# Patient Record
Sex: Female | Born: 1975 | Race: White | Hispanic: No | Marital: Single | Smoking: Never smoker
Health system: Southern US, Community
[De-identification: ages and names within clinical notes are randomized; demographics above are authoritative.]

## PROBLEM LIST (undated history)

## (undated) DIAGNOSIS — R3129 Other microscopic hematuria: Secondary | ICD-10-CM

## (undated) DIAGNOSIS — I10 Essential (primary) hypertension: Secondary | ICD-10-CM

## (undated) DIAGNOSIS — N2889 Other specified disorders of kidney and ureter: Secondary | ICD-10-CM

## (undated) DIAGNOSIS — N83299 Other ovarian cyst, unspecified side: Secondary | ICD-10-CM

## (undated) DIAGNOSIS — N200 Calculus of kidney: Secondary | ICD-10-CM

## (undated) HISTORY — DX: Other specified disorders of kidney and ureter: N28.89

## (undated) HISTORY — DX: Essential (primary) hypertension: I10

## (undated) HISTORY — DX: Other microscopic hematuria: R31.29

## (undated) HISTORY — DX: Other ovarian cyst, unspecified side: N83.299

## (undated) HISTORY — DX: Calculus of kidney: N20.0

---

## 1994-01-28 HISTORY — PX: CHOLECYSTECTOMY: SHX55

## 2012-07-09 ENCOUNTER — Ambulatory Visit: Payer: Self-pay

## 2012-07-09 LAB — URINALYSIS, COMPLETE
Bilirubin,UR: NEGATIVE
Leukocyte Esterase: NEGATIVE
Ph: 6 (ref 4.5–8.0)
Protein: 30

## 2012-07-26 ENCOUNTER — Ambulatory Visit: Payer: Self-pay

## 2012-07-26 LAB — URINALYSIS, COMPLETE
Bilirubin,UR: NEGATIVE
Blood: NEGATIVE
Glucose,UR: NEGATIVE mg/dL (ref 0–75)
Ketone: NEGATIVE
Ph: 6 (ref 4.5–8.0)
Specific Gravity: 1.01 (ref 1.003–1.030)
WBC UR: >30 /HPF (ref 0–5)

## 2014-05-10 ENCOUNTER — Ambulatory Visit
Admit: 2014-05-10 | Disposition: A | Discharge status: Home / Self Care | Payer: Self-pay | Attending: Family Medicine | Admitting: Family Medicine

## 2014-05-10 LAB — URINALYSIS, COMPLETE
BACTERIA: NEGATIVE
Bilirubin,UR: NEGATIVE
Glucose,UR: NEGATIVE
LEUKOCYTE ESTERASE: NEGATIVE
Nitrite: NEGATIVE
Ph: 6 (ref 5.0–8.0)
WBC UR: NONE SEEN /HPF (ref 0–5)

## 2014-05-14 ENCOUNTER — Ambulatory Visit
Admit: 2014-05-14 | Disposition: A | Discharge status: Home / Self Care | Payer: Self-pay | Attending: Family Medicine | Admitting: Family Medicine

## 2014-05-14 LAB — URINALYSIS, COMPLETE
BACTERIA: NEGATIVE
BILIRUBIN, UR: NEGATIVE
GLUCOSE, UR: NEGATIVE
KETONE: NEGATIVE
Leukocyte Esterase: NEGATIVE
Nitrite: NEGATIVE
Ph: 5.5 (ref 5.0–8.0)
RBC,UR: >30 /HPF (ref 0–5)
SPECIFIC GRAVITY: 1.02 (ref 1.000–1.030)

## 2014-05-14 LAB — PREGNANCY, URINE: Pregnancy Test, Urine: NEGATIVE m[IU]/mL

## 2014-05-16 LAB — URINE CULTURE

## 2014-05-23 ENCOUNTER — Ambulatory Visit
Admit: 2014-05-23 | Disposition: A | Discharge status: Home / Self Care | Payer: Self-pay | Attending: Urology | Admitting: Urology

## 2014-07-18 ENCOUNTER — Other Ambulatory Visit: Payer: Self-pay | Admitting: Family Medicine

## 2014-07-18 DIAGNOSIS — N2889 Other specified disorders of kidney and ureter: Secondary | ICD-10-CM

## 2014-11-22 ENCOUNTER — Encounter: Payer: Self-pay | Admitting: *Deleted

## 2014-12-01 ENCOUNTER — Ambulatory Visit: Payer: BC Managed Care – PPO | Admitting: Urology

## 2015-08-14 ENCOUNTER — Ambulatory Visit
Admission: EM | Admit: 2015-08-14 | Discharge: 2015-08-14 | Disposition: A | Discharge status: Home / Self Care | Payer: BC Managed Care – PPO | Attending: Family Medicine | Admitting: Family Medicine

## 2015-08-14 ENCOUNTER — Encounter: Payer: Self-pay | Admitting: *Deleted

## 2015-08-14 DIAGNOSIS — H9202 Otalgia, left ear: Secondary | ICD-10-CM | POA: Diagnosis not present

## 2015-08-14 DIAGNOSIS — H6122 Impacted cerumen, left ear: Secondary | ICD-10-CM | POA: Diagnosis not present

## 2015-08-14 MED ORDER — AMOXICILLIN 875 MG PO TABS: 875.0000 mg | ORAL_TABLET | Freq: Two times a day (BID) | ORAL | Status: DC

## 2015-08-14 NOTE — ED Notes (Signed)
Distorted hearing in left ear last week. Onset of left ear pain yesterday. Denies drainage and fever.

## 2015-08-14 NOTE — ED Provider Notes (Signed)
CSN: [id number]     Arrival date & time 08/14/15  1442 History   First MD Initiated Contact with Patient 08/14/15 1512     Chief Complaint  Patient presents with  . Otalgia   (Consider location/radiation/quality/duration/timing/severity/associated sxs/prior Treatment) HPI Comments: 40 yo female with a 1 week h/o left ear pain and "fullness" sensation. Denies any drainage, fevers, chills.  The history is provided by the patient.    Past Medical History  Diagnosis Date  . Functional ovarian cysts   . Renal mass, right   . Microscopic hematuria   . Kidney stone on left side   . Hypertension    Past Surgical History  Procedure Laterality Date  . Cholecystectomy  1996   Family History  Problem Relation Age of Onset  . Prostate cancer Father   . Cancer Father     Blood   Social History  Substance Use Topics  . Smoking status: Never Smoker   . Smokeless tobacco: None  . Alcohol Use: No   OB History    No data available     Review of Systems  Allergies  Review of patient's allergies indicates no known allergies.  Home Medications   Prior to Admission medications   Medication Sig Start Date End Date Taking? Authorizing Provider  amoxicillin (AMOXIL) 875 MG tablet Take 1 tablet (875 mg total) by mouth 2 (two) times daily. 08/14/15   Payton Mccallum, MD   Meds Ordered and Administered this Visit  Medications - No data to display  BP 152/99 mmHg  Pulse 64  Temp(Src) 99 F (37.2 C) (Oral)  Resp 16  Ht  (1.727 m)  Wt 155 lb (70.308 kg)  BMI 23.57 kg/m2  SpO2 100%  LMP 08/13/2015 (Exact Date) No data found.   Physical Exam  Constitutional: She appears well-developed and well-nourished. No distress.  HENT:  Head: Normocephalic and atraumatic.  Right Ear: Tympanic membrane, external ear and ear canal normal.  Left Ear: External ear normal.  Nose: Mucosal edema and rhinorrhea present. No nose lacerations, sinus tenderness, nasal deformity, septal deviation or  nasal septal hematoma. No epistaxis.  No foreign bodies. Right sinus exhibits maxillary sinus tenderness and frontal sinus tenderness. Left sinus exhibits maxillary sinus tenderness and frontal sinus tenderness.  Mouth/Throat: Uvula is midline, oropharynx is clear and moist and mucous membranes are normal. No oropharyngeal exudate.  Left ear canal with cerumen impaction; TM visualized after cerumen disimpaction and slightly erythematous and bulging  Eyes: Conjunctivae and EOM are normal. Pupils are equal, round, and reactive to light. Right eye exhibits no discharge. Left eye exhibits no discharge. No scleral icterus.  Neck: Normal range of motion. Neck supple. No thyromegaly present.  Lymphadenopathy:    She has no cervical adenopathy.  Skin: She is not diaphoretic.  Nursing note and vitals reviewed.   ED Course  Procedures (including critical care time)  Labs Review Labs Reviewed - No data to display  Imaging Review No results found.   Visual Acuity Review  Right Eye Distance:   Left Eye Distance:   Bilateral Distance:    Right Eye Near:   Left Eye Near:    Bilateral Near:         MDM   1. Otalgia, left   2. Cerumen impaction, left    Discharge Medication List as of 08/14/2015  3:34 PM    START taking these medications   Details  amoxicillin (AMOXIL) 875 MG tablet Take 1 tablet (875 mg total) by  mouth 2 (two) times daily., Starting 08/14/2015, Until Discontinued, Print       1.  diagnosis reviewed with patient 2. rx as per orders above; reviewed possible side effects, interactions, risks and benefits  3. Recommend supportive treatment with otc analgesics prn 4. Follow-up prn if symptoms worsen or don't improve   Payton Mccallumrlando Deetta Siegmann, MD 08/14/15 1540

## 2015-08-14 NOTE — Discharge Instructions (Signed)
Earache An earache, also called otalgia, can be caused by many things. Pain from an earache can be sharp, dull, or burning. The pain may be temporary or constant. Earaches can be caused by problems with the ear, such as infection in either the middle ear or the ear canal, injury, impacted ear wax, middle ear pressure, or a foreign body in the ear. Ear pain can also result from problems in other areas. This is called referred pain. For example, pain can come from a sore throat, a tooth infection, or problems with the jaw or the joint between the jaw and the skull (temporomandibular joint, or TMJ). The cause of an earache is not always easy to identify. Watchful waiting may be appropriate for some earaches until a clear cause of the pain can be found. HOME CARE INSTRUCTIONS Watch your condition for any changes. The following actions may help to lessen any discomfort that you are feeling:  Take medicines only as directed by your health care provider. This includes ear drops.  Apply ice to your outer ear to help reduce pain.  Put ice in a plastic bag.  Place a towel between your skin and the bag.  Leave the ice on for 20 minutes, 2-3 times per day.  Do not put anything in your ear other than medicine that is prescribed by your health care provider.  Try resting in an upright position instead of lying down. This may help to reduce pressure in the middle ear and relieve pain.  Chew gum if it helps to relieve your ear pain.  Control any allergies that you have.  Keep all follow-up visits as directed by your health care provider. This is important. SEEK MEDICAL CARE IF:  Your pain does not improve within 2 days.  You have a fever.  You have new or worsening symptoms. SEEK IMMEDIATE MEDICAL CARE IF:  You have a severe headache.  You have a stiff neck.  You have difficulty swallowing.  You have redness or swelling behind your ear.  You have drainage from your ear.  You have hearing  loss.  You feel dizzy.   This information is not intended to replace advice given to you by your health care provider. Make sure you discuss any questions you have with your health care provider.   Document Released: 09/01/2003 Document Revised: 02/04/2014 Document Reviewed: 08/15/2013 Elsevier Interactive Patient Education 2016 [employer]  

## 2016-01-05 IMAGING — CT CT ABDOMEN WO/W CM
2 of 4 series · 12 of 32 positions shown, 18 images · IV contrast (omnipaque)
Comparison: Noncontrast CT on 05/14/2014

CLINICAL DATA: Left flank pain. Left ureteral calculus and
hydronephrosis. Indeterminate complex cystic right renal mass seen
on recent noncontrast CT.

EXAM:
CT ABDOMEN WITHOUT AND WITH CONTRAST
TECHNIQUE: Multidetector CT imaging of the abdomen was performed following the
standard protocol before and following the bolus administration of
intravenous contrast.
CONTRAST:  100 mL Omnipaque 350

[Series 3: lung · axial · 0.70mm/px · z∈[-663,-503]mm · 3 of 64 slices shown]
[im 16/64  bone]
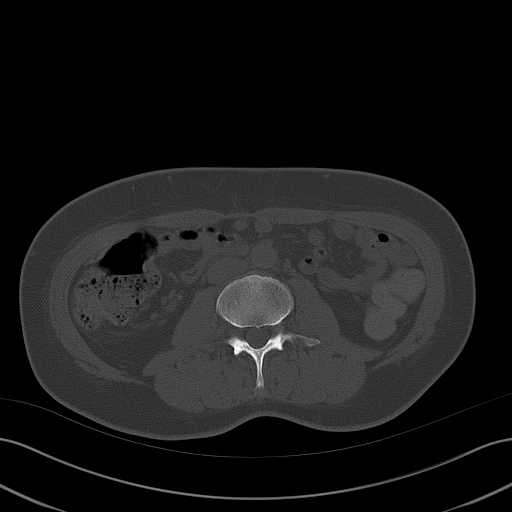
[im 32/64  bone]
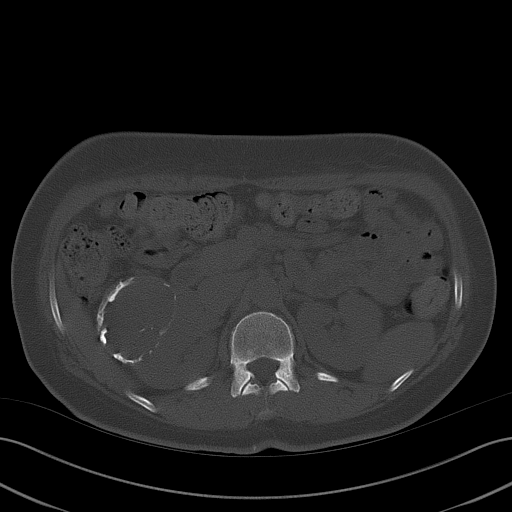
[im 48/64  bone]
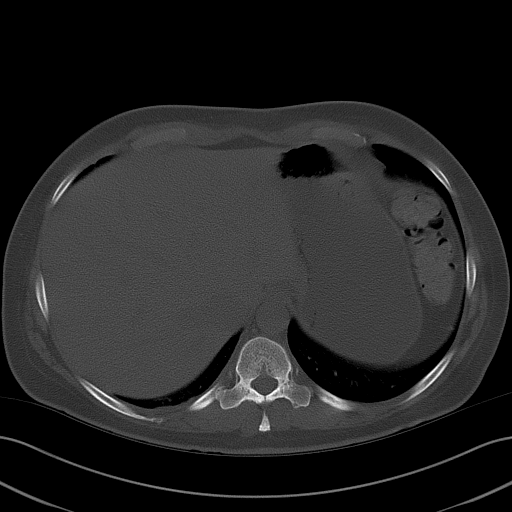

[Series 6: renal venous · axial · portal-venous · 0.70mm/px · z∈[-709,-455]mm · 9 of 159 slices shown, 15 images]
[im 16/159  soft-tissue]
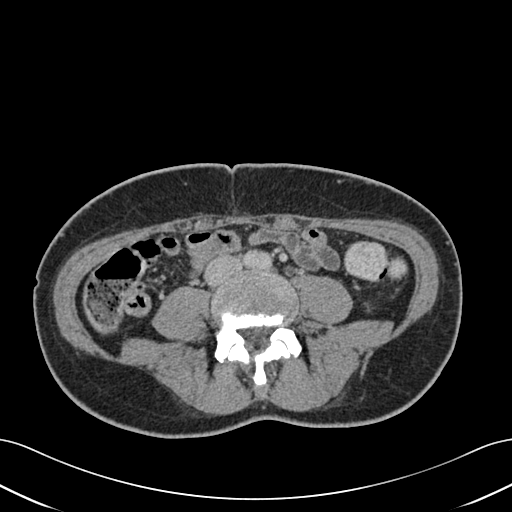
[im 16/159  bone]
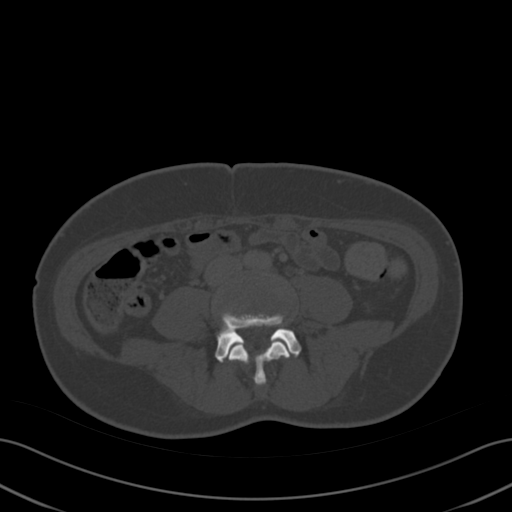
[im 32/159  soft-tissue]
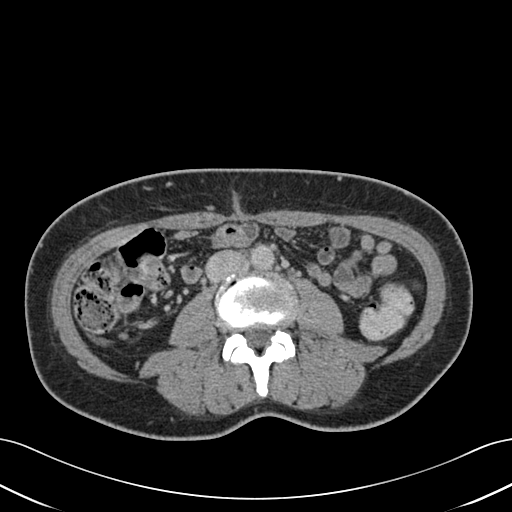
[im 48/159  soft-tissue]
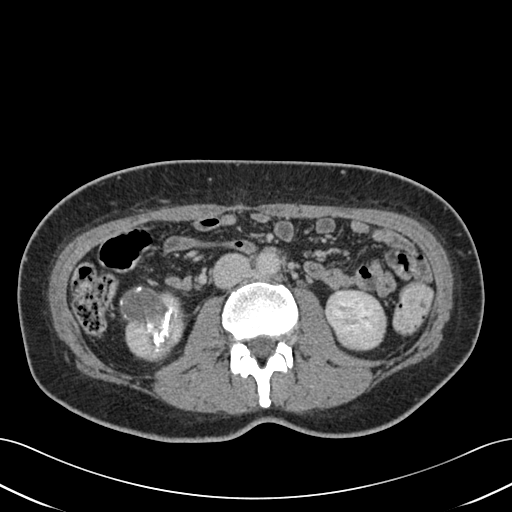
[im 64/159  soft-tissue]
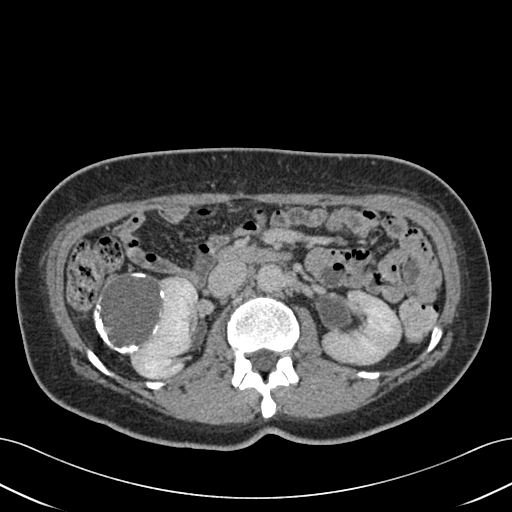
[im 80/159  soft-tissue]
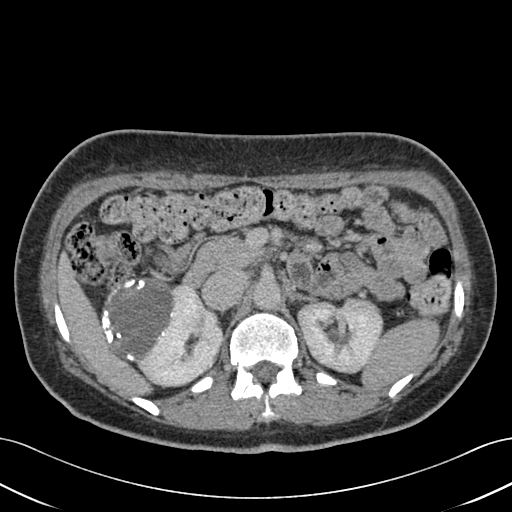
[im 95/159  soft-tissue]
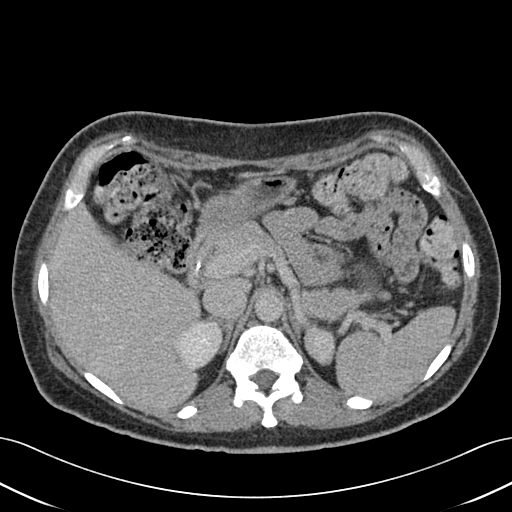
[im 95/159  lung]
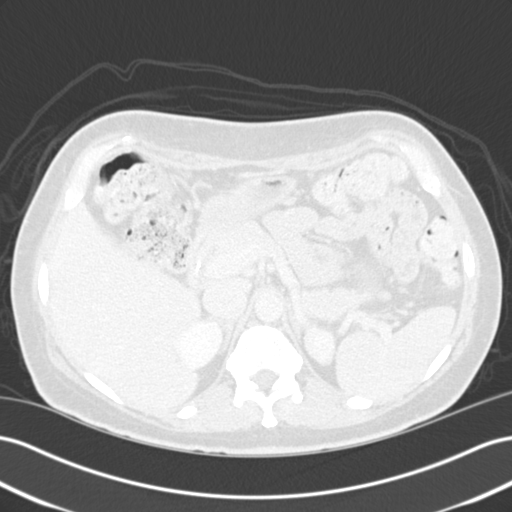
[im 111/159  soft-tissue]
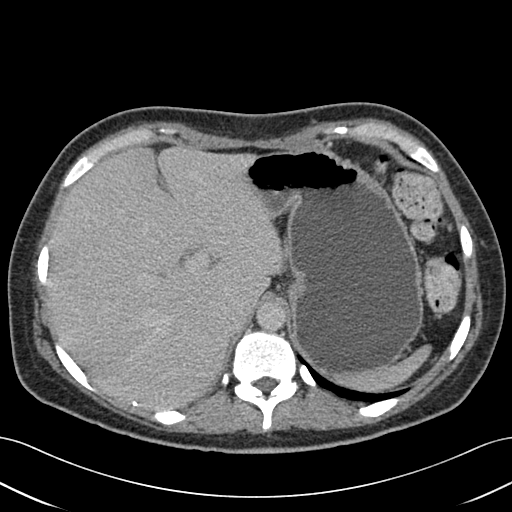
[im 111/159  lung]
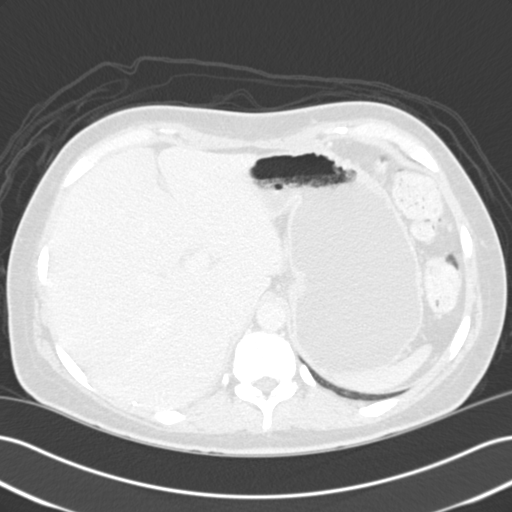
[im 127/159  soft-tissue]
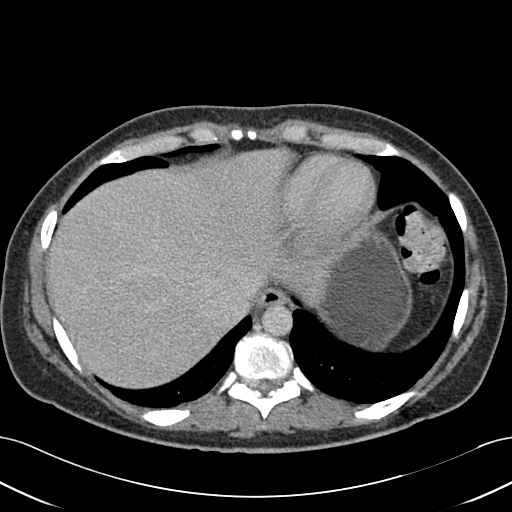
[im 127/159  lung]
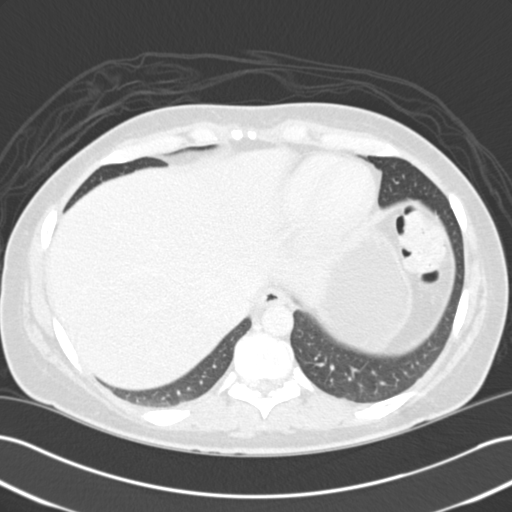
[im 143/159  soft-tissue]
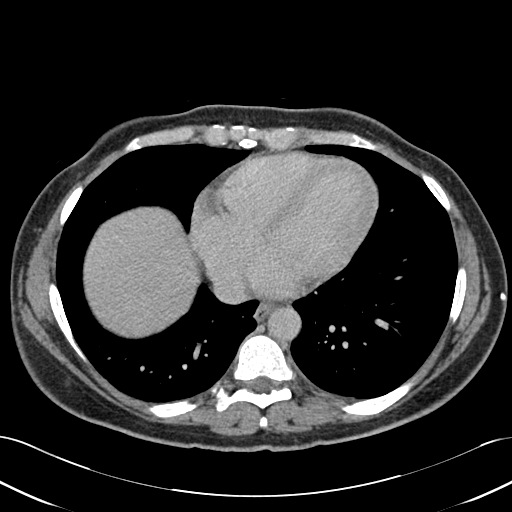
[im 143/159  lung]
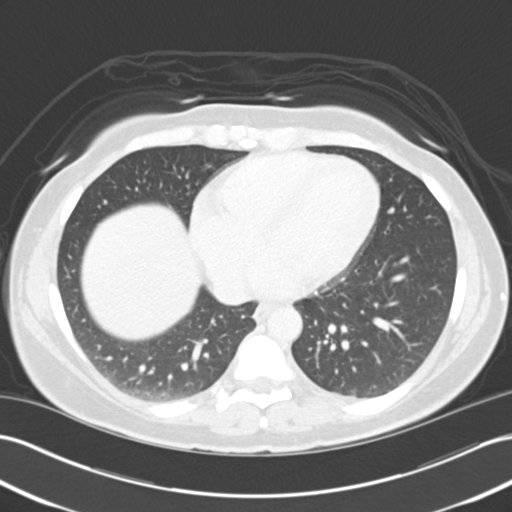
[im 143/159  bone]
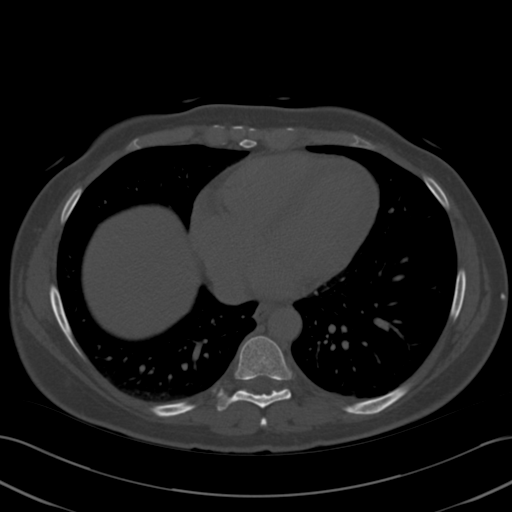

[12 of 32 positions shown; findings below may reference images not displayed]

FINDINGS: Lower chest:  Unremarkable.

Hepatobiliary: A 2.3 cm hypervascular mass is seen in the lateral
segment of the left hepatic lobe on image 46/series 4. Although this
is not definitively characterized by CT, this favors benign lesions
such as focal nodular hyperplasia, adenoma, and flash filling
hemangioma in patient without history of cancer or cirrhosis. No
other liver masses are identified. Prior cholecystectomy noted.

Pancreas: No mass, inflammatory changes, or other significant
abnormality identified.

Spleen:  Within normal limits in size and appearance.

Adrenal Glands:  No masses identified.

Kidneys: Previously seen calculus in proximal left ureter is no
longer visualized on this abdomen only exam, and mild left
hydronephrosis shows decreased since previous study. Distal left
ureter not visualized on this exam. Several tiny benign left renal
cysts again noted.

Complex subcapsular cystic lesion is seen in the mid and lower poles
of the left kidney which shows thick peripheral calcifications with
a few calcified internal septations. This lesion shows no evidence
of contrast enhancement. This lesion measures 5.1 x 6.5 x 9.5 cm,
and is consistent with an indeterminate Bosniak category 2 F lesion.

Stomach/Bowel/Peritoneum: No evidence of wall thickening, mass, or
obstruction involving visualized abdominal bowel.

Vascular/Lymphatic: No pathologically enlarged lymph nodes
identified. No other significant abnormality noted.

Other:  None.

Musculoskeletal:  No suspicious bone lesions identified.
IMPRESSION: Previously seen proximal left ureteral calculus is no longer
visualized within the abdomen. Mild left hydronephrosis has
decreased since previous study. Distal left ureter not visualized on
today's abdomen only exam.

9.5 cm indeterminate but probably benign Bosniak category 2 F cystic
lesion in right kidney. 2.4 cm probably benign hypervascular lesion
in the left hepatic lobe, with major differential considerations
including focal nodular hyperplasia, adenoma, and flash filling
hemangioma. Followup for both of these lesions is recommended with
abdomen MRI without and with Eovist contrast in 6 months. This
recommendation follows ACR consensus guidelines: Managing Incidental
Findings on Abdominal CT: White Paper of the ACR Incidental Findings
Committee. [HOSPITAL] [id number];[DATE].

## 2019-05-17 ENCOUNTER — Ambulatory Visit
Admission: EM | Admit: 2019-05-17 | Discharge: 2019-05-17 | Disposition: A | Discharge status: Home / Self Care | Payer: BC Managed Care – PPO

## 2019-05-17 DIAGNOSIS — H6123 Impacted cerumen, bilateral: Secondary | ICD-10-CM | POA: Diagnosis not present

## 2019-05-17 DIAGNOSIS — H9201 Otalgia, right ear: Secondary | ICD-10-CM

## 2019-05-17 NOTE — ED Triage Notes (Signed)
Pt presents with c/o right ear pain/pressure for 4-5 days that increased last night. Pt denies fever/chills, dizziness/headache, n/v/d, sinus congestion or other symptoms.

## 2019-05-17 NOTE — ED Provider Notes (Signed)
Kristen Acosta   MRN: [id number] DOB: 15-Apr-1975  Subjective:   Kristen Acosta is a 44 y.o. female presenting for 4-5 day hx of acute onset right ear pain and pressure worse last night.  Patient has a history of earwax buildup, infections as a child.  Denies fever, ear drainage, runny or stuffy nose, sinus congestion, cough, sore throat, chest pain, shortness of breath.  Denies taking chronic medications.  No Known Allergies  Past Medical History:  Diagnosis Date  . Functional ovarian cysts   . Hypertension   . Kidney stone on left side   . Microscopic hematuria   . Renal mass, right      Past Surgical History:  Procedure Laterality Date  . CHOLECYSTECTOMY  1996    Family History  Problem Relation Age of Onset  . Prostate cancer Father   . Cancer Father        Blood    Social History   Tobacco Use  . Smoking status: Never Smoker  . Smokeless tobacco: Never Used  Substance Use Topics  . Alcohol use: No    Alcohol/week: 0.0 standard drinks  . Drug use: Never    ROS   Objective:   Vitals: BP 137/89 (BP Location: Left Arm)   Pulse (!) 57   Temp 98.3 F (36.8 C) (Oral)   Resp 18   Ht 5\' 8"  (1.727 m)   Wt 160 lb (72.6 kg)   LMP 05/03/2019 (Approximate)   SpO2 98%   BMI 24.33 kg/m   Physical Exam Constitutional:      General: She is not in acute distress.    Appearance: She is well-developed. She is not ill-appearing.  HENT:     Head: Normocephalic and atraumatic.     Right Ear: Tympanic membrane, ear canal and external ear normal. No drainage or tenderness. No middle ear effusion. There is impacted cerumen. Tympanic membrane is not erythematous.     Left Ear: Tympanic membrane, ear canal and external ear normal. No drainage or tenderness.  No middle ear effusion. There is impacted cerumen. Tympanic membrane is not erythematous.     Nose: No congestion or rhinorrhea.     Mouth/Throat:     Mouth: Mucous membranes are moist. No oral  lesions.     Pharynx: Oropharynx is clear. No pharyngeal swelling, oropharyngeal exudate, posterior oropharyngeal erythema or uvula swelling.     Tonsils: No tonsillar exudate or tonsillar abscesses.  Eyes:     Extraocular Movements:     Right eye: Normal extraocular motion.     Left eye: Normal extraocular motion.     Conjunctiva/sclera: Conjunctivae normal.     Pupils: Pupils are equal, round, and reactive to light.  Cardiovascular:     Rate and Rhythm: Normal rate.  Pulmonary:     Effort: Pulmonary effort is normal.  Musculoskeletal:     Cervical back: Normal range of motion and neck supple.  Lymphadenopathy:     Cervical: No cervical adenopathy.  Skin:    General: Skin is warm and dry.  Neurological:     General: No focal deficit present.     Mental Status: She is alert and oriented to person, place, and time.  Psychiatric:        Mood and Affect: Mood normal.        Behavior: Behavior normal.    Ear lavage performed using mixture of peroxide and water.  Pressure irrigation performed using a bottle and a thin ear tube.  Bilateral  ear lavage.  Curette was used to remove right sided cerumen.   Assessment and Plan :   PDMP not reviewed this encounter.  1. Right ear pain   2. Bilateral impacted cerumen     Successful bilateral ear lavage. TMs clear s/p ear lavage. General management of cerumen impaction reviewed with patient.  Anticipatory guidance provided. Counseled patient on potential for adverse effects with medications prescribed/recommended today, ER and return-to-clinic precautions discussed, patient verbalized understanding.    Wallis Bamberg, PA-C 05/17/19 1000

## 2020-06-15 ENCOUNTER — Other Ambulatory Visit: Payer: Self-pay

## 2020-06-15 ENCOUNTER — Ambulatory Visit
Admission: EM | Admit: 2020-06-15 | Discharge: 2020-06-15 | Disposition: A | Discharge status: Home / Self Care | Payer: BC Managed Care – PPO | Attending: Emergency Medicine | Admitting: Emergency Medicine

## 2020-06-15 ENCOUNTER — Encounter: Payer: Self-pay | Admitting: Emergency Medicine

## 2020-06-15 DIAGNOSIS — J069 Acute upper respiratory infection, unspecified: Secondary | ICD-10-CM | POA: Diagnosis not present

## 2020-06-15 NOTE — ED Provider Notes (Signed)
[name]    CSN: [id number] Arrival date & time: 06/15/20  1130      History   Chief Complaint Chief Complaint  Patient presents with  . Nasal Congestion    HPI Kristen Acosta is a 45 y.o. female.   Patient presents with 4-day history of nasal congestion, ear pain, sinus pressure, sinus headache, low-grade fever.  T-max 99.8.  Treatment attempted at home with OTC decongestant.  She denies rash, cough, shortness of breath, vomiting, diarrhea, or other symptoms.  Her medical history includes hypertension, kidney stone, ovarian cysts.  The history is provided by the patient and medical records.    Past Medical History:  Diagnosis Date  . Functional ovarian cysts   . Hypertension   . Kidney stone on left side   . Microscopic hematuria   . Renal mass, right     There are no problems to display for this patient.   Past Surgical History:  Procedure Laterality Date  . CHOLECYSTECTOMY  1996    OB History   No obstetric history on file.      Home Medications    Prior to Admission medications   Medication Sig Start Date End Date Taking? Authorizing Provider  Multiple Vitamin (MULTIVITAMIN) tablet Take 1 tablet by mouth daily.   Yes [provider]  amoxicillin (AMOXIL) 875 MG tablet Take 1 tablet (875 mg total) by mouth 2 (two) times daily. 08/14/15   Payton Mccallum, MD    Family History Family History  Problem Relation Age of Onset  . Prostate cancer Father   . Cancer Father        Blood    Social History Social History   Tobacco Use  . Smoking status: Never Smoker  . Smokeless tobacco: Never Used  Vaping Use  . Vaping Use: Never used  Substance Use Topics  . Alcohol use: No    Alcohol/week: 0.0 standard drinks  . Drug use: Never     Allergies   Patient has no known allergies.   Review of Systems Review of Systems  Constitutional: Positive for fever. Negative for chills.  HENT: Positive for congestion, ear pain,  postnasal drip, rhinorrhea, sinus pressure and sinus pain. Negative for sore throat.   Respiratory: Negative for cough and shortness of breath.   Cardiovascular: Negative for chest pain and palpitations.  Gastrointestinal: Negative for abdominal pain, diarrhea and vomiting.  Skin: Negative for color change and rash.  All other systems reviewed and are negative.    Physical Exam Triage Vital Signs ED Triage Vitals  Enc Vitals Group     BP 06/15/20 1146 137/86     Pulse Rate 06/15/20 1146 69     Resp 06/15/20 1146 14     Temp 06/15/20 1146 99.1 F (37.3 C)     Temp Source 06/15/20 1146 Oral     SpO2 06/15/20 1146 97 %     Weight --      Height --      Head Circumference --      Peak Flow --      Pain Score 06/15/20 1142 3     Pain Loc --      Pain Edu? --      Excl. in GC? --    No data found.  Updated Vital Signs BP 137/86 (BP Location: Left Arm)   Pulse 69   Temp 99.1 F (37.3 C) (Oral)   Resp 14   LMP 06/09/2020   SpO2 97%  Visual Acuity Right Eye Distance:   Left Eye Distance:   Bilateral Distance:    Right Eye Near:   Left Eye Near:    Bilateral Near:     Physical Exam Vitals and nursing note reviewed.  Constitutional:      General: She is not in acute distress.    Appearance: She is well-developed.  HENT:     Head: Normocephalic and atraumatic.     Right Ear: Tympanic membrane normal.     Left Ear: Tympanic membrane normal.     Nose: Congestion present.     Mouth/Throat:     Mouth: Mucous membranes are moist.     Pharynx: Oropharynx is clear.  Eyes:     Conjunctiva/sclera: Conjunctivae normal.  Cardiovascular:     Rate and Rhythm: Normal rate and regular rhythm.     Heart sounds: Normal heart sounds.  Pulmonary:     Effort: Pulmonary effort is normal. No respiratory distress.     Breath sounds: Normal breath sounds.  Abdominal:     Palpations: Abdomen is soft.     Tenderness: There is no abdominal tenderness.  Musculoskeletal:      Cervical back: Neck supple.  Skin:    General: Skin is warm and dry.  Neurological:     General: No focal deficit present.     Mental Status: She is alert and oriented to person, place, and time.  Psychiatric:        Mood and Affect: Mood normal.        Behavior: Behavior normal.      UC Treatments / Results  Labs (all labs ordered are listed, but only abnormal results are displayed) Labs Reviewed  COVID-19, FLU A+B NAA    EKG   Radiology No results found.  Procedures Procedures (including critical care time)  Medications Ordered in UC Medications - No data to display  Initial Impression / Assessment and Plan / UC Course  I have reviewed the triage vital signs and the nursing notes.  Pertinent labs & imaging results that were available during my care of the patient were reviewed by me and considered in my medical decision making (see chart for details).   Viral URI.  Influenza and COVID pending.  Instructed patient to self quarantine until the test results are back.  Discussed symptomatic treatment including Tylenol or ibuprofen, rest, hydration.  Instructed patient to follow up with PCP if her symptoms are not improving.  Patient agrees to plan of care.    Final Clinical Impressions(s) / UC Diagnoses   Final diagnoses:  Viral URI     Discharge Instructions     Your COVID and Influenza tests are pending.  You should self quarantine until the test results are back.    Take Tylenol or ibuprofen as needed for fever or discomfort.  Rest and keep yourself hydrated.    Follow-up with your primary care provider if your symptoms are not improving.        ED Prescriptions    None     PDMP not reviewed this encounter.   Mickie Bail, NP 06/15/20 [id number]

## 2020-06-15 NOTE — ED Triage Notes (Addendum)
Patient c/o nasal congestion x 4 days.  Patient endorses a temperature of 99.8 F at work.   Patient denies cough.   Patient endorses RT sided ear pain, sinus pressure,  and headache.    Patient denies SOB or Chest Pain.   Patient reports preforming a COVID-19 test at home and having a negative test result per patient statement.   Patient has taken OTC decongestant with no relief of symptoms.

## 2020-06-15 NOTE — Discharge Instructions (Addendum)
Your COVID and Influenza tests are pending.  You should self quarantine until the test results are back.    Take Tylenol or ibuprofen as needed for fever or discomfort.  Rest and keep yourself hydrated.    Follow-up with your primary care provider if your symptoms are not improving.     

## 2020-06-16 LAB — COVID-19, FLU A+B NAA
Influenza A, NAA: NOT DETECTED
Influenza B, NAA: NOT DETECTED
SARS-CoV-2, NAA: NOT DETECTED

## 2020-10-05 ENCOUNTER — Other Ambulatory Visit: Payer: Self-pay

## 2020-10-05 ENCOUNTER — Emergency Department
Admission: EM | Admit: 2020-10-05 | Discharge: 2020-10-05 | Disposition: A | Discharge status: Home / Self Care | Payer: No Typology Code available for payment source | Attending: Emergency Medicine | Admitting: Emergency Medicine

## 2020-10-05 ENCOUNTER — Emergency Department: Payer: No Typology Code available for payment source

## 2020-10-05 DIAGNOSIS — Y990 Civilian activity done for income or pay: Secondary | ICD-10-CM | POA: Insufficient documentation

## 2020-10-05 DIAGNOSIS — W2209XA Striking against other stationary object, initial encounter: Secondary | ICD-10-CM | POA: Diagnosis not present

## 2020-10-05 DIAGNOSIS — S0083XA Contusion of other part of head, initial encounter: Secondary | ICD-10-CM | POA: Diagnosis present

## 2020-10-05 DIAGNOSIS — I10 Essential (primary) hypertension: Secondary | ICD-10-CM | POA: Diagnosis not present

## 2020-10-05 DIAGNOSIS — S022XXA Fracture of nasal bones, initial encounter for closed fracture: Secondary | ICD-10-CM | POA: Insufficient documentation

## 2020-10-05 MED ORDER — ACETAMINOPHEN 325 MG PO TABS
650.0000 mg | ORAL_TABLET | Freq: Once | ORAL | Status: AC
  Administered 2020-10-05: 650 mg via ORAL
  Filled 2020-10-05: qty 2

## 2020-10-05 MED ORDER — OXYMETAZOLINE HCL 0.05 % NA SOLN
1.0000 | Freq: Once | NASAL | Status: DC
  Filled 2020-10-05: qty 30

## 2020-10-05 NOTE — ED Provider Notes (Signed)
[facility] Emergency Department Provider Note ____________________________________________  Time seen: 1129  I have reviewed the triage vital signs and the nursing notes.  HISTORY  Chief Complaint  Facial Injury   HPI Kristen Acosta is a 45 y.o. female presents to the ED from work, for evaluation of a work-related injury.  She arrives via EMS, after a panel of a bookshelf came down hitting her on the nose.  She presents with bleeding currently controlled.  She denies any LOC, dental injury, or vision change.  Past Medical History:  Diagnosis Date   Functional ovarian cysts    Hypertension    Kidney stone on left side    Microscopic hematuria    Renal mass, right     There are no problems to display for this patient.   Past Surgical History:  Procedure Laterality Date   CHOLECYSTECTOMY  1996    Prior to Admission medications   Medication Sig Start Date End Date Taking? Authorizing Provider  Multiple Vitamin (MULTIVITAMIN) tablet Take 1 tablet by mouth daily.    [provider]    Allergies Patient has no known allergies.  Family History  Problem Relation Age of Onset   Prostate cancer Father    Cancer Father        Blood    Social History Social History   Tobacco Use   Smoking status: Never   Smokeless tobacco: Never  Vaping Use   Vaping Use: Never used  Substance Use Topics   Alcohol use: No    Alcohol/week: 0.0 standard drinks   Drug use: Never    Review of Systems  Constitutional: Negative for fever. Eyes: Negative for visual changes. ENT: Negative for sore throat.  Nosebleed as above. Cardiovascular: Negative for chest pain. Respiratory: Negative for shortness of breath. Gastrointestinal: Negative for abdominal pain, vomiting and diarrhea. Genitourinary: Negative for dysuria. Musculoskeletal: Negative for back pain. Skin: Negative for rash.  Facial contusion/laceration Neurological: Negative for  headaches, focal weakness or numbness. ____________________________________________  PHYSICAL EXAM:  VITAL SIGNS: ED Triage Vitals  Enc Vitals Group     BP 10/05/20 1026 137/75     Pulse Rate 10/05/20 1026 (!) 53     Resp 10/05/20 1026 15     Temp 10/05/20 1026 97.6 F (36.4 C)     Temp Source 10/05/20 1026 Oral     SpO2 10/05/20 1026 100 %     Weight 10/05/20 1125 160 lb 0.9 oz (72.6 kg)     Height 10/05/20 1125 5\' 8"  (1.727 m)     Head Circumference --      Peak Flow --      Pain Score --      Pain Loc --      Pain Edu? --      Excl. in GC? --     Constitutional: Alert and oriented. Well appearing and in no distress. GCS = 15 Head: Normocephalic and atraumatic. Eyes: Conjunctivae are normal. PERRL. Normal extraocular movements Ears: Canals clear. TMs intact bilaterally. Nose: No congestion/rhinorrhea/epistaxis.  No septal hematomas appreciated.  Superficial linear laceration across the nasal bridge. Mouth/Throat: Mucous membranes are moist. Cardiovascular: Normal rate, regular rhythm. Normal distal pulses. Respiratory: Normal respiratory effort. No wheezes/rales/rhonchi. Musculoskeletal: Nontender with normal range of motion in all extremities.  Neurologic:  Normal gait without ataxia. Normal speech and language. No gross focal neurologic deficits are appreciated. Skin:  Skin is warm, dry and intact. No rash noted. Psychiatric: Mood and affect are normal.  Patient exhibits appropriate insight and judgment. ____________________________________________    {LABS (pertinent positives/negatives)  ____________________________________________  {EKG  ____________________________________________   RADIOLOGY Official radiology report(s): CT HEAD WO CONTRAST ( )  Result Date: 10/05/2020 CLINICAL DATA:  Facial injury, shelf fell on face, struck nose EXAM: CT HEAD WITHOUT CONTRAST CT MAXILLOFACIAL WITHOUT CONTRAST TECHNIQUE: Multidetector CT imaging of the head and  maxillofacial structures were performed using the standard protocol without intravenous contrast. Multiplanar CT image reconstructions of the maxillofacial structures were also generated. COMPARISON:  None. FINDINGS: CT HEAD FINDINGS Brain: No evidence of acute infarction, hemorrhage, hydrocephalus, extra-axial collection or mass lesion/mass effect. Vascular: No hyperdense vessel or unexpected calcification. Skull: Normal. Negative for fracture or focal lesion. Other: None. CT MAXILLOFACIAL FINDINGS Osseous: Nondisplaced fractures of the nasal bones (series 3, image 5). No other fracture or mandibular dislocation. No destructive process. Orbits: Negative. No traumatic or inflammatory finding. Sinuses: Mucoid retention cysts of the right maxillary sinus. Soft tissues: Soft tissue edema of the nose. IMPRESSION: 1. No acute intracranial pathology. 2. Nondisplaced fractures of the nasal bones with overlying soft tissue edema. Electronically Signed   By: Lauralyn Primes M.D.   On: 10/05/2020 12:54   CT Maxillofacial Wo Contrast  Result Date: 10/05/2020 CLINICAL DATA:  Facial injury, shelf fell on face, struck nose EXAM: CT HEAD WITHOUT CONTRAST CT MAXILLOFACIAL WITHOUT CONTRAST TECHNIQUE: Multidetector CT imaging of the head and maxillofacial structures were performed using the standard protocol without intravenous contrast. Multiplanar CT image reconstructions of the maxillofacial structures were also generated. COMPARISON:  None. FINDINGS: CT HEAD FINDINGS Brain: No evidence of acute infarction, hemorrhage, hydrocephalus, extra-axial collection or mass lesion/mass effect. Vascular: No hyperdense vessel or unexpected calcification. Skull: Normal. Negative for fracture or focal lesion. Other: None. CT MAXILLOFACIAL FINDINGS Osseous: Nondisplaced fractures of the nasal bones (series 3, image 5). No other fracture or mandibular dislocation. No destructive process. Orbits: Negative. No traumatic or inflammatory finding.  Sinuses: Mucoid retention cysts of the right maxillary sinus. Soft tissues: Soft tissue edema of the nose. IMPRESSION: 1. No acute intracranial pathology. 2. Nondisplaced fractures of the nasal bones with overlying soft tissue edema. Electronically Signed   By: Lauralyn Primes M.D.   On: 10/05/2020 12:54   ____________________________________________  PROCEDURES  Afrin 0.05%  Acetaminophen 650 mg PO  .Marland KitchenLaceration Repair  Date/Time: 10/05/2020 11:11 AM Performed by: Lissa Hoard, PA-C Authorized by: Lissa Hoard, PA-C   Consent:    Consent obtained:  Verbal   Consent given by:  Patient   Risks, benefits, and alternatives were discussed: yes     Risks discussed:  Pain and poor cosmetic result   Alternatives discussed:  No treatment Universal protocol:    Imaging studies available: yes     Site/side marked: yes     Patient identity confirmed:  Verbally with patient Anesthesia:    Anesthesia method:  None Laceration details:    Location:  Face   Face location:  Nose   Length (cm):  0.5   Depth (mm):  2 Exploration:    Limited defect created (wound extended): no     Contaminated: no   Treatment:    Area cleansed with:  Soap and water   Amount of cleaning:  Standard   Irrigation solution:  Tap water   Debridement:  None   Undermining:  None   Scar revision: no   Skin repair:    Repair method:  Tissue adhesive Approximation:    Approximation:  Close Repair type:  Repair type:  Simple Post-procedure details:    Dressing:  Open (no dressing)   Procedure completion:  Tolerated well, no immediate complications ____________________________________________   INITIAL IMPRESSION / ASSESSMENT AND PLAN / ED COURSE  As part of my medical decision making, I reviewed the following data within the electronic MEDICAL RECORD NUMBER Radiograph reviewed as noted and Notes from prior ED visits    DDX: closed head injury, nasal fracture, facial bone fractures  Patient  ED evaluation of mechanical injury resulted in a laceration across the nose and subsequent nosebleed.  Patient is evaluated for complaints, with a head CT does negative for any acute intracranial process.  CT of the max face does not confirm nondisplaced fractures of the nasal bones.  Patient exam is otherwise reassuring that she has no septal hematoma on exam.  Superficial wound is cleansed and closed using wound adhesive.  She is discharged to follow with  ENT as needed for future fracture evaluation.  Olamide Lahaie was evaluated in Emergency Department on 10/05/2020 for the symptoms described in the history of present illness. She was evaluated in the context of the global COVID-19 pandemic, which necessitated consideration that the patient might be at risk for infection with the SARS-CoV-2 virus that causes COVID-19. Institutional protocols and algorithms that pertain to the evaluation of patients at risk for COVID-19 are in a state of rapid change based on information released by regulatory bodies including the CDC and federal and state organizations. These policies and algorithms were followed during the patient's care in the ED.  ____________________________________________  FINAL CLINICAL IMPRESSION(S) / ED DIAGNOSES  Final diagnoses:  Facial contusion, initial encounter  Closed fracture of nasal bone, initial encounter      Lissa Hoard, PA-C 10/05/20 1623    Sharman Cheek, MD 10/06/20 2342

## 2020-10-05 NOTE — ED Notes (Signed)
Patient stable and discharged with all personal belongings and AVS. AVS and discharge instructions reviewed with patient and opportunity for questions provided.   

## 2020-10-05 NOTE — ED Triage Notes (Signed)
Pt comes into the ED via EMS from work, states she a panel from a bookshelf came back hitting her in the nose, bleeding conrolled  #18g RAC LR given [id number] 48HR 100%RA 82/49initial b/p 100/64

## 2020-10-05 NOTE — ED Notes (Signed)
See triage note  presents with injury to face   states she had a book fall  hitting in the nose

## 2020-10-05 NOTE — Discharge Instructions (Signed)
Keep the wounds clean and dry. Follow-up with [facility] for further fracture care. Use the nasal spray for nose bleed and nasal congestion management.

## 2020-10-05 NOTE — ED Notes (Signed)
Patient transported to CT 

## 2020-11-24 ENCOUNTER — Ambulatory Visit
Admission: RE | Admit: 2020-11-24 | Discharge: 2020-11-24 | Disposition: A | Discharge status: Home / Self Care | Payer: BC Managed Care – PPO | Source: Ambulatory Visit | Attending: Emergency Medicine | Admitting: Emergency Medicine

## 2020-11-24 ENCOUNTER — Other Ambulatory Visit: Payer: Self-pay

## 2020-11-24 VITALS — BP 152/88 | HR 60 | Temp 98.9°F | Resp 16

## 2020-11-24 DIAGNOSIS — H6981 Other specified disorders of Eustachian tube, right ear: Secondary | ICD-10-CM

## 2020-11-24 DIAGNOSIS — H6123 Impacted cerumen, bilateral: Secondary | ICD-10-CM

## 2020-11-24 NOTE — ED Triage Notes (Signed)
Pt here with right ear pain and pressure x 1 week.

## 2020-11-24 NOTE — Discharge Instructions (Addendum)
Use Flonase to help with fluid to ears.  You may use Debrox drops once weekly to help with cerumen buildup. You may apply mineral oil soaked cotton balls into both ears once weekly to help with cerumen buildup. Do not use Q-Tips as this may make impactions worse. Keep ears clean and dry. If you have worsening of symptoms return to clinic for reevaluation.

## 2020-11-24 NOTE — ED Provider Notes (Signed)
Chief Complaint   Chief Complaint  Patient presents with   Otalgia     Subjective, HPI  Kristen Acosta is a very pleasant 45 y.o. female who presents with right ear pain and pressure for the last 1 week.  Patient reports a strong history of ear infections and ear issues.  No reported fever, chills, vomiting or additional concerns today.  Patient's problem list, past medical and social history, medications, and allergies were reviewed by me and updated in Epic.   ROS  See HPI.  Objective   Vitals:   11/24/20 1644  BP: (!) 152/88  Pulse: 60  Resp: 16  Temp: 98.9 F (37.2 C)  SpO2: 98%     General: Appears well-developed and well-nourished. No acute distress.  HEENT Head: Normocephalic and atraumatic. Eyes: Conjunctivae and EOM are normal. No eye drainage or scleral icterus bilaterally.  Ears: Bilateral: Hearing grossly intact. No drainage or visible deformity. No mastoid erythema, edema, or tenderness.  Right: Cerumen impaction, unable to visualize TM. Left: Cerumen impaction unable to visualize TM. Nose: No nasal deviation. Mouth/Throat: No stridor or tracheal deviation. Neck: Normal range of motion, neck is supple.  Cardiovascular: Normal rate Pulm/Chest: No respiratory distress. No accessory muscle usage, speaking in full sentences.  Musculoskeletal: No joint deformity, normal range of motion.  Skin: Skin is warm and dry.    Vital signs and nursing note reviewed.    Assessment & Plan  1. Bilateral impacted cerumen  2. Dysfunction of right eustachian tube  45 y.o. female presents with right ear pain and pressure for the last 1 week.  No reported fever, chills, vomiting or additional concerns today.  Ear lavage completed in clinic today by nursing staff and cerumen impaction to right ear cleared, left ear unable to completely clear.  There is no evidence of infection to right TM.  Right TM does exhibit fluid effusion.  Likely eustachian tube dysfunction.   Advised about home treatment and care with Flonase, Debrox and to not use Q-tips.  Return to clinic with any worsening of symptoms.  Patient verbalized understanding and agreed with plan.  Patient stable upon discharge.  Plan:   Discharge Instructions      Use Flonase to help with fluid to ears.  You may use Debrox drops once weekly to help with cerumen buildup. You may apply mineral oil soaked cotton balls into both ears once weekly to help with cerumen buildup. Do not use Q-Tips as this may make impactions worse. Keep ears clean and dry. If you have worsening of symptoms return to clinic for reevaluation.          [name], FNP 11/24/20 1726

## 2021-09-24 ENCOUNTER — Ambulatory Visit
Admission: EM | Admit: 2021-09-24 | Discharge: 2021-09-24 | Disposition: A | Discharge status: Home / Self Care | Payer: BC Managed Care – PPO | Attending: Physician Assistant | Admitting: Physician Assistant

## 2021-09-24 ENCOUNTER — Other Ambulatory Visit: Payer: Self-pay

## 2021-09-24 ENCOUNTER — Encounter: Payer: Self-pay | Admitting: Emergency Medicine

## 2021-09-24 DIAGNOSIS — W57XXXA Bitten or stung by nonvenomous insect and other nonvenomous arthropods, initial encounter: Secondary | ICD-10-CM

## 2021-09-24 DIAGNOSIS — M79601 Pain in right arm: Secondary | ICD-10-CM | POA: Diagnosis not present

## 2021-09-24 DIAGNOSIS — S50861A Insect bite (nonvenomous) of right forearm, initial encounter: Secondary | ICD-10-CM

## 2021-09-24 MED ORDER — NAPROXEN 500 MG PO TABS: 500.0000 mg | ORAL_TABLET | Freq: Two times a day (BID) | ORAL | 0 refills | Status: AC | PRN

## 2021-09-24 MED ORDER — TRIAMCINOLONE ACETONIDE 0.025 % EX OINT: 1.0000 | TOPICAL_OINTMENT | Freq: Two times a day (BID) | CUTANEOUS | 0 refills | Status: AC

## 2021-09-24 NOTE — Discharge Instructions (Signed)
-  You likely have a pulled muscle.  Ice the area and/or apply heat see which feels best. - I sent anti-inflammatory medicine and you may also take Tylenol. - Avoid any strenuous use of your arms or upper body, weight lifting until this gets better. - If pain worsens or you develop numbness or weakness in the arm, you should be seen again immediately.  For any severe acute worsening of symptoms go to ER. - You appear to also have a bug bite in the area.  Likely unrelated.  You may ice this area as well.  I sent a topical corticosteroid ointment to help.

## 2021-09-24 NOTE — ED Triage Notes (Addendum)
Pt c/o right upper arm pain. Started this morning. Denies injury. She states pain is worse when touched. She states the pain radiates down to her hand and feels like her arm is asleep. Pt states her arm is also swollen. Pt has what looks like a bug bite just below where the pain is.

## 2021-09-24 NOTE — ED Provider Notes (Signed)
[facility]    CSN: [id number] Arrival date & time: 09/24/21  1204      History   Chief Complaint Chief Complaint  Patient presents with   Arm Pain    Right     HPI Kristen Acosta is a 46 y.o. female presenting for mild (2/10) pain and swelling of the right upper arm that randomly began this morning.  She says it is tender to palpation.  She denies any injury.  She states she did workout yesterday and did a lot of cardio and arm exercises but denies using weights.  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  Patient believes she may have gotten bitten by a bug bite on her forearm.  She is unsure if this is related to her current pain.  She says the area mildly itches but is not painful.  She has full range of motion of her arm without any pain.  No pain in her neck, weakness or numbness but she says her arm does feel sort of heavy and like it is almost asleep.  She is right-handed.  She has not taken any medication or done anything to help with her symptoms today.  HPI  Past Medical History:  Diagnosis Date   Functional ovarian cysts    Hypertension    Kidney stone on left side    Microscopic hematuria    Renal mass, right     There are no problems to display for this patient.   Past Surgical History:  Procedure Laterality Date   CHOLECYSTECTOMY  1996    OB History   No obstetric history on file.      Home Medications    Prior to Admission medications   Medication Sig Start Date End Date Taking? Authorizing Provider  Multiple Vitamin (MULTIVITAMIN) tablet Take 1 tablet by mouth daily.   Yes [provider]  naproxen (NAPROSYN) 500 MG tablet Take 1 tablet (500 mg total) by mouth 2 (two) times daily as needed for up to 10 days for moderate pain. 09/24/21 10/04/21 Yes Eusebio Friendly B, PA-C   triamcinolone (KENALOG) 0.025 % ointment Apply 1 Application topically 2 (two) times daily. 09/24/21  Yes Shirlee Latch, PA-C    Family History Family History  Problem Relation Age of Onset   Prostate cancer Father    Cancer Father        Blood    Social History Social History   Tobacco Use   Smoking status: Never   Smokeless tobacco: Never  Vaping Use   Vaping Use: Never used  Substance Use Topics   Alcohol use: No    Alcohol/week: 0.0 standard drinks of alcohol   Drug use: Never     Allergies   Patient has no known allergies.   Review of Systems Review of Systems  Constitutional:  Negative for fatigue and fever.  Musculoskeletal:  Positive for arthralgias and joint swelling. Negative for neck pain and neck stiffness.  Skin:  Positive for rash. Negative for color change and wound.  Neurological:  Negative for weakness and numbness.     Physical Exam Triage Vital Signs ED Triage Vitals [09/24/21 1226]  Enc Vitals Group     BP      Pulse      Resp  Temp      Temp src      SpO2      Weight      Height      Head Circumference      Peak Flow      Pain Score 2     Pain Loc      Pain Edu?      Excl. in GC?    No data found.  Updated Vital Signs BP (!) 158/93 (BP Location: Left Arm)   Pulse (!) 59   Temp 98.2 F (36.8 C) (Oral)   Resp 16   LMP 09/10/2021   SpO2 97%     Physical Exam Vitals and nursing note reviewed.  Constitutional:      General: She is not in acute distress.    Appearance: Normal appearance. She is not ill-appearing or toxic-appearing.  HENT:     Head: Normocephalic and atraumatic.  Eyes:     General: No scleral icterus.       Right eye: No discharge.        Left eye: No discharge.     Conjunctiva/sclera: Conjunctivae normal.  Cardiovascular:     Rate and Rhythm: Regular rhythm. Bradycardia present.     Heart sounds: Normal heart sounds.  Pulmonary:     Effort: Pulmonary effort is normal. No respiratory distress.      Breath sounds: Normal breath sounds.  Musculoskeletal:     Cervical back: Neck supple.     Comments: Right arm: No deformity, swelling, ecchymosis, lacerations or abrasions.  There is a small area of erythema with a central erythematous papule of the ventral right forearm consistent with an insect bite or sting.  She has tenderness to palpation of her biceps and proximal biceps tendon.  Full range of motion of arm at all joints.  Good strength.   No neck tenderness.  Full range motion of neck.  Skin:    General: Skin is dry.  Neurological:     General: No focal deficit present.     Mental Status: She is alert. Mental status is at baseline.     Motor: No weakness.     Gait: Gait normal.  Psychiatric:        Mood and Affect: Mood normal.        Behavior: Behavior normal.        Thought Content: Thought content normal.      UC Treatments / Results  Labs (all labs ordered are listed, but only abnormal results are displayed) Labs Reviewed - No data to display  EKG   Radiology No results found.  Procedures Procedures (including critical care time)  Medications Ordered in UC Medications - No data to display  Initial Impression / Assessment and Plan / UC Course  I have reviewed the triage vital signs and the nursing notes.  Pertinent labs & imaging results that were available during my care of the patient were reviewed by me and considered in my medical decision making (see chart for details).   46 year old female presenting for pain and swelling of the right upper arm that randomly started today.  Pain is mild.  Patient was instructed to be evaluated per the school nurse.  Patient is overall well-appearing.  She has some tenderness of her biceps and proximal biceps tendon and also an area of erythema and a papule which look to be consistent with insect bite/sting.  Likely unrelated to her pain.  Very low suspicion for infection.  She  has full range of motion of her upper  extremity.  Advised patient may have just pulled a muscle when she was exercising yesterday.  Supportive care encouraged.  Sent triamcinolone topical for the insect bite and naproxen for the arm pain and inflammation.  Advised against any painful activities.  Return or follow-up with PCP or Ortho if no improvement in the next week or if symptoms acutely worsen.   Final Clinical Impressions(s) / UC Diagnoses   Final diagnoses:  Right arm pain  Insect bite of right forearm, initial encounter     Discharge Instructions      -You likely have a pulled muscle.  Ice the area and/or apply heat see which feels best. - I sent anti-inflammatory medicine and you may also take Tylenol. - Avoid any strenuous use of your arms or upper body, weight lifting until this gets better. - If pain worsens or you develop numbness or weakness in the arm, you should be seen again immediately.  For any severe acute worsening of symptoms go to ER. - You appear to also have a bug bite in the area.  Likely unrelated.  You may ice this area as well.  I sent a topical corticosteroid ointment to help.      ED Prescriptions     Medication Sig Dispense Auth. Provider   triamcinolone (KENALOG) 0.025 % ointment Apply 1 Application topically 2 (two) times daily. 30 g Eusebio Friendly B, PA-C   naproxen (NAPROSYN) 500 MG tablet Take 1 tablet (500 mg total) by mouth 2 (two) times daily as needed for up to 10 days for moderate pain. 20 tablet Gareth Morgan      PDMP not reviewed this encounter.   Shirlee Latch, PA-C 09/24/21 1326

## 2022-10-19 ENCOUNTER — Telehealth: Payer: BC Managed Care – PPO | Admitting: Nurse Practitioner

## 2022-10-19 DIAGNOSIS — B9689 Other specified bacterial agents as the cause of diseases classified elsewhere: Secondary | ICD-10-CM

## 2022-10-19 DIAGNOSIS — J019 Acute sinusitis, unspecified: Secondary | ICD-10-CM | POA: Diagnosis not present

## 2022-10-19 MED ORDER — AMOXICILLIN-POT CLAVULANATE 875-125 MG PO TABS: 1.0000 | ORAL_TABLET | Freq: Two times a day (BID) | ORAL | 0 refills | Status: AC

## 2022-10-19 NOTE — Progress Notes (Signed)
Virtual Visit Consent   Kristen Acosta, you are scheduled for a virtual visit with a [facility] provider today. Just as with appointments in the office, your consent must be obtained to participate. Your consent will be active for this visit and any virtual visit you may have with one of our providers in the next 365 days. If you have a MyChart account, a copy of this consent can be sent to you electronically.  As this is a virtual visit, video technology does not allow for your provider to perform a traditional examination. This may limit your provider's ability to fully assess your condition. If your provider identifies any concerns that need to be evaluated in person or the need to arrange testing (such as labs, EKG, etc.), we will make arrangements to do so. Although advances in technology are sophisticated, we cannot ensure that it will always work on either your end or our end. If the connection with a video visit is poor, the visit may have to be switched to a telephone visit. With either a video or telephone visit, we are not always able to ensure that we have a secure connection.  By engaging in this virtual visit, you consent to the provision of healthcare and authorize for your insurance to be billed (if applicable) for the services provided during this visit. Depending on your insurance coverage, you may receive a charge related to this service.  I need to obtain your verbal consent now. Are you willing to proceed with your visit today? Dorin Volkov has provided verbal consent on 10/19/2022 for a virtual visit (video or telephone). Claiborne Rigg, NP  Date: 10/19/2022 8:40 AM  Virtual Visit via Video Note   I, Claiborne Rigg, connected with  Kristen Acosta  ([id number], 03-Feb-1975) on 10/19/22 at  8:45 AM EDT by a video-enabled telemedicine application and verified that I am speaking with the correct person using two identifiers.  Location: Patient: Virtual Visit  Location Patient: Home Provider: Virtual Visit Location Provider: Home Office   I discussed the limitations of evaluation and management by telemedicine and the availability of in person appointments. The patient expressed understanding and agreed to proceed.    History of Present Illness: Kristen Acosta is a 47 y.o. who identifies as a female who was assigned female at birth, and is being seen today for sinusitis  Patient complains of congestion, facial pain, headache described as pressure, nasal congestion, non productive cough, sinus pressure, hoarseness, and sore throat. Onset of symptoms was several weeks ago, gradually worsening since that time. She is drinking plenty of fluids.  Patient is non-smoker Taking mucinex OTC with only temporary relief of symptoms.     Problems: There are no problems to display for this patient.   Allergies: No Known Allergies Medications:  Current Outpatient Medications:    amoxicillin-clavulanate (AUGMENTIN) 875-125 MG tablet, Take 1 tablet by mouth 2 (two) times daily for 7 days., Disp: 14 tablet, Rfl: 0   Multiple Vitamin (MULTIVITAMIN) tablet, Take 1 tablet by mouth daily., Disp: , Rfl:    triamcinolone (KENALOG) 0.025 % ointment, Apply 1 Application topically 2 (two) times daily., Disp: 30 g, Rfl: 0  Observations/Objective: Patient is well-developed, well-nourished in no acute distress.  Resting comfortably at home.  Head is normocephalic, atraumatic.  No labored breathing.  Speech is clear and coherent with logical content.  Patient is alert and oriented at baseline.    Assessment and Plan: 1. Acute bacterial sinusitis -  amoxicillin-clavulanate (AUGMENTIN) 875-125 MG tablet; Take 1 tablet by mouth 2 (two) times daily for 7 days.  Dispense: 14 tablet; Refill: 0  INSTRUCTIONS: use a humidifier for nasal congestion Drink plenty of fluids, rest and wash hands frequently to avoid the spread of infection Alternate tylenol and Motrin for  relief of fever   Follow Up Instructions: I discussed the assessment and treatment plan with the patient. The patient was provided an opportunity to ask questions and all were answered. The patient agreed with the plan and demonstrated an understanding of the instructions.  A copy of instructions were sent to the patient via MyChart unless otherwise noted below.    The patient was advised to call back or seek an in-person evaluation if the symptoms worsen or if the condition fails to improve as anticipated.  Time:  I spent 11 minutes with the patient via telehealth technology discussing the above problems/concerns.    Claiborne Rigg, NP

## 2022-10-19 NOTE — Patient Instructions (Signed)
  Jorene Minors, thank you for joining Claiborne Rigg, NP for today's virtual visit.  While this provider is not your primary care provider (PCP), if your PCP is located in our provider database this encounter information will be shared with them immediately following your visit.   A [facility] MyChart account gives you access to today's visit and all your visits, tests, and labs performed at [facility] " click here if you don't have a [facility] MyChart account or go to [email or web address]  Consent: (Patient) Kristen Acosta provided verbal consent for this virtual visit at the beginning of the encounter.  Current Medications:  Current Outpatient Medications:    amoxicillin-clavulanate (AUGMENTIN) 875-125 MG tablet, Take 1 tablet by mouth 2 (two) times daily for 7 days., Disp: 14 tablet, Rfl: 0   Multiple Vitamin (MULTIVITAMIN) tablet, Take 1 tablet by mouth daily., Disp: , Rfl:    triamcinolone (KENALOG) 0.025 % ointment, Apply 1 Application topically 2 (two) times daily., Disp: 30 g, Rfl: 0   Medications ordered in this encounter:  Meds ordered this encounter  Medications   amoxicillin-clavulanate (AUGMENTIN) 875-125 MG tablet    Sig: Take 1 tablet by mouth 2 (two) times daily for 7 days.    Dispense:  14 tablet    Refill:  0    Order Specific Question:   Supervising Provider    Answer:   Merrilee Jansky [id number]     *If you need refills on other medications prior to your next appointment, please contact your pharmacy*  Follow-Up: Call back or seek an in-person evaluation if the symptoms worsen or if the condition fails to improve as anticipated.  [facility] [phone]  Other Instructions INSTRUCTIONS: use a humidifier for nasal congestion Drink plenty of fluids, rest and wash hands frequently to avoid the spread of infection Alternate tylenol and Motrin for relief of fever    If you have been instructed to have an  in-person evaluation today at a local Urgent Care facility, please use the link below. It will take you to a list of all of our available [facility], including address, phone number and hours of operation. Please do not delay care.  [facility]  If you or a family member do not have a primary care provider, use the link below to schedule a visit and establish care. When you choose a [facility] primary care physician or advanced practice provider, you gain a long-term partner in health. Find a Primary Care Provider  Learn more about [facility]'s in-office and virtual care options:  - Get Care Now
